# Patient Record
Sex: Male | Born: 1940 | Race: White | Hispanic: No | Marital: Married | Smoking: Former smoker
Health system: Southern US, Community
[De-identification: ages and names within clinical notes are randomized; demographics above are authoritative.]

## PROBLEM LIST (undated history)

## (undated) DIAGNOSIS — T4145XA Adverse effect of unspecified anesthetic, initial encounter: Secondary | ICD-10-CM

## (undated) DIAGNOSIS — I442 Atrioventricular block, complete: Secondary | ICD-10-CM

## (undated) DIAGNOSIS — E119 Type 2 diabetes mellitus without complications: Secondary | ICD-10-CM

## (undated) DIAGNOSIS — I2510 Atherosclerotic heart disease of native coronary artery without angina pectoris: Secondary | ICD-10-CM

## (undated) DIAGNOSIS — T8859XA Other complications of anesthesia, initial encounter: Secondary | ICD-10-CM

## (undated) DIAGNOSIS — I10 Essential (primary) hypertension: Secondary | ICD-10-CM

## (undated) DIAGNOSIS — D649 Anemia, unspecified: Secondary | ICD-10-CM

## (undated) DIAGNOSIS — Z950 Presence of cardiac pacemaker: Secondary | ICD-10-CM

## (undated) DIAGNOSIS — K219 Gastro-esophageal reflux disease without esophagitis: Secondary | ICD-10-CM

## (undated) HISTORY — PX: INSERT / REPLACE / REMOVE PACEMAKER: SUR710

## (undated) HISTORY — PX: COLONOSCOPY: SHX174

## (undated) HISTORY — PX: EYE SURGERY: SHX253

## (undated) HISTORY — PX: HERNIA REPAIR: SHX51

## (undated) HISTORY — PX: APPENDECTOMY: SHX54

---

## 2004-12-13 ENCOUNTER — Other Ambulatory Visit: Payer: Self-pay

## 2004-12-14 ENCOUNTER — Other Ambulatory Visit: Payer: Self-pay

## 2004-12-14 ENCOUNTER — Inpatient Hospital Stay: Payer: Self-pay | Admitting: Internal Medicine

## 2005-03-30 ENCOUNTER — Other Ambulatory Visit: Payer: Self-pay

## 2005-03-31 ENCOUNTER — Inpatient Hospital Stay: Payer: Self-pay | Admitting: Internal Medicine

## 2005-03-31 ENCOUNTER — Other Ambulatory Visit: Payer: Self-pay

## 2007-11-15 ENCOUNTER — Ambulatory Visit: Payer: Self-pay | Admitting: Internal Medicine

## 2008-10-01 ENCOUNTER — Ambulatory Visit: Payer: Self-pay | Admitting: Family Medicine

## 2008-10-01 ENCOUNTER — Emergency Department: Payer: Self-pay | Admitting: Unknown Physician Specialty

## 2008-10-03 ENCOUNTER — Inpatient Hospital Stay: Payer: Self-pay | Admitting: Internal Medicine

## 2008-10-03 ENCOUNTER — Ambulatory Visit: Payer: Self-pay | Admitting: Internal Medicine

## 2008-10-09 ENCOUNTER — Ambulatory Visit: Payer: Self-pay | Admitting: Internal Medicine

## 2008-10-16 ENCOUNTER — Observation Stay: Payer: Self-pay | Admitting: Internal Medicine

## 2008-11-02 ENCOUNTER — Ambulatory Visit: Payer: Self-pay | Admitting: Internal Medicine

## 2010-05-19 ENCOUNTER — Ambulatory Visit: Payer: Self-pay | Admitting: Gastroenterology

## 2010-05-21 LAB — PATHOLOGY REPORT

## 2011-01-06 IMAGING — CR DG CHEST 1V PORT
1 series · 1 of 1 positions shown · non-contrast
Comparison: none

REASON FOR EXAM: fever chills
COMMENTS:

[view not recorded]
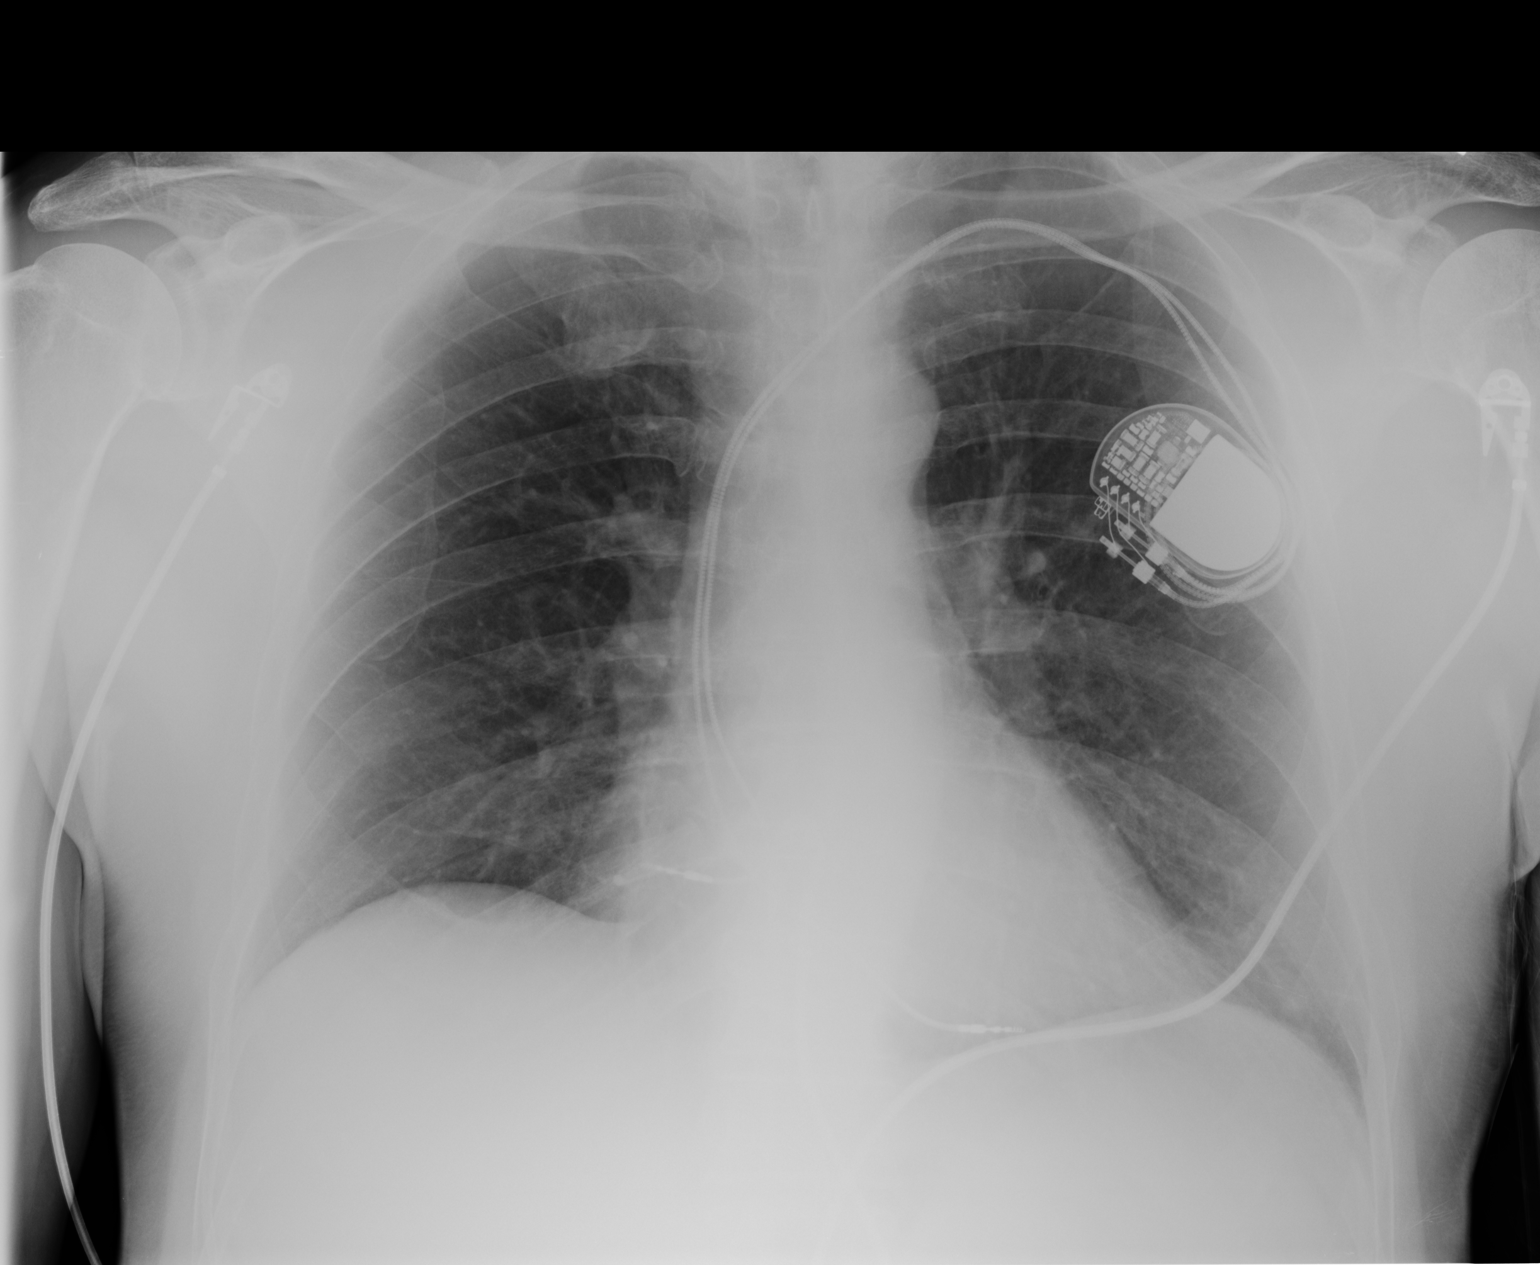

[1 of 1 positions shown; findings below may reference images not displayed]

PROCEDURE:     DXR - DXR PORTABLE CHEST SINGLE VIEW  - October 01, 2008  [DATE]

RESULT:     Comparison is made to the study of 03/31/2005. A left-sided
pacemaker has been placed since the previous exam. There is no pneumothorax.
The lungs are clear. The heart appears normal. The bony structures are
unremarkable.
IMPRESSION: Pacemaker present. No acute cardiopulmonary disease evident.

## 2011-01-08 IMAGING — CR DG CHEST 2V
1 series · 3 of 3 positions shown · non-contrast
Comparison: none

REASON FOR EXAM: thrombocytopenia
COMMENTS:

[Series 1: view not recorded · 0.17mm/px · 3 of 3 slices shown]
[im 1/3]
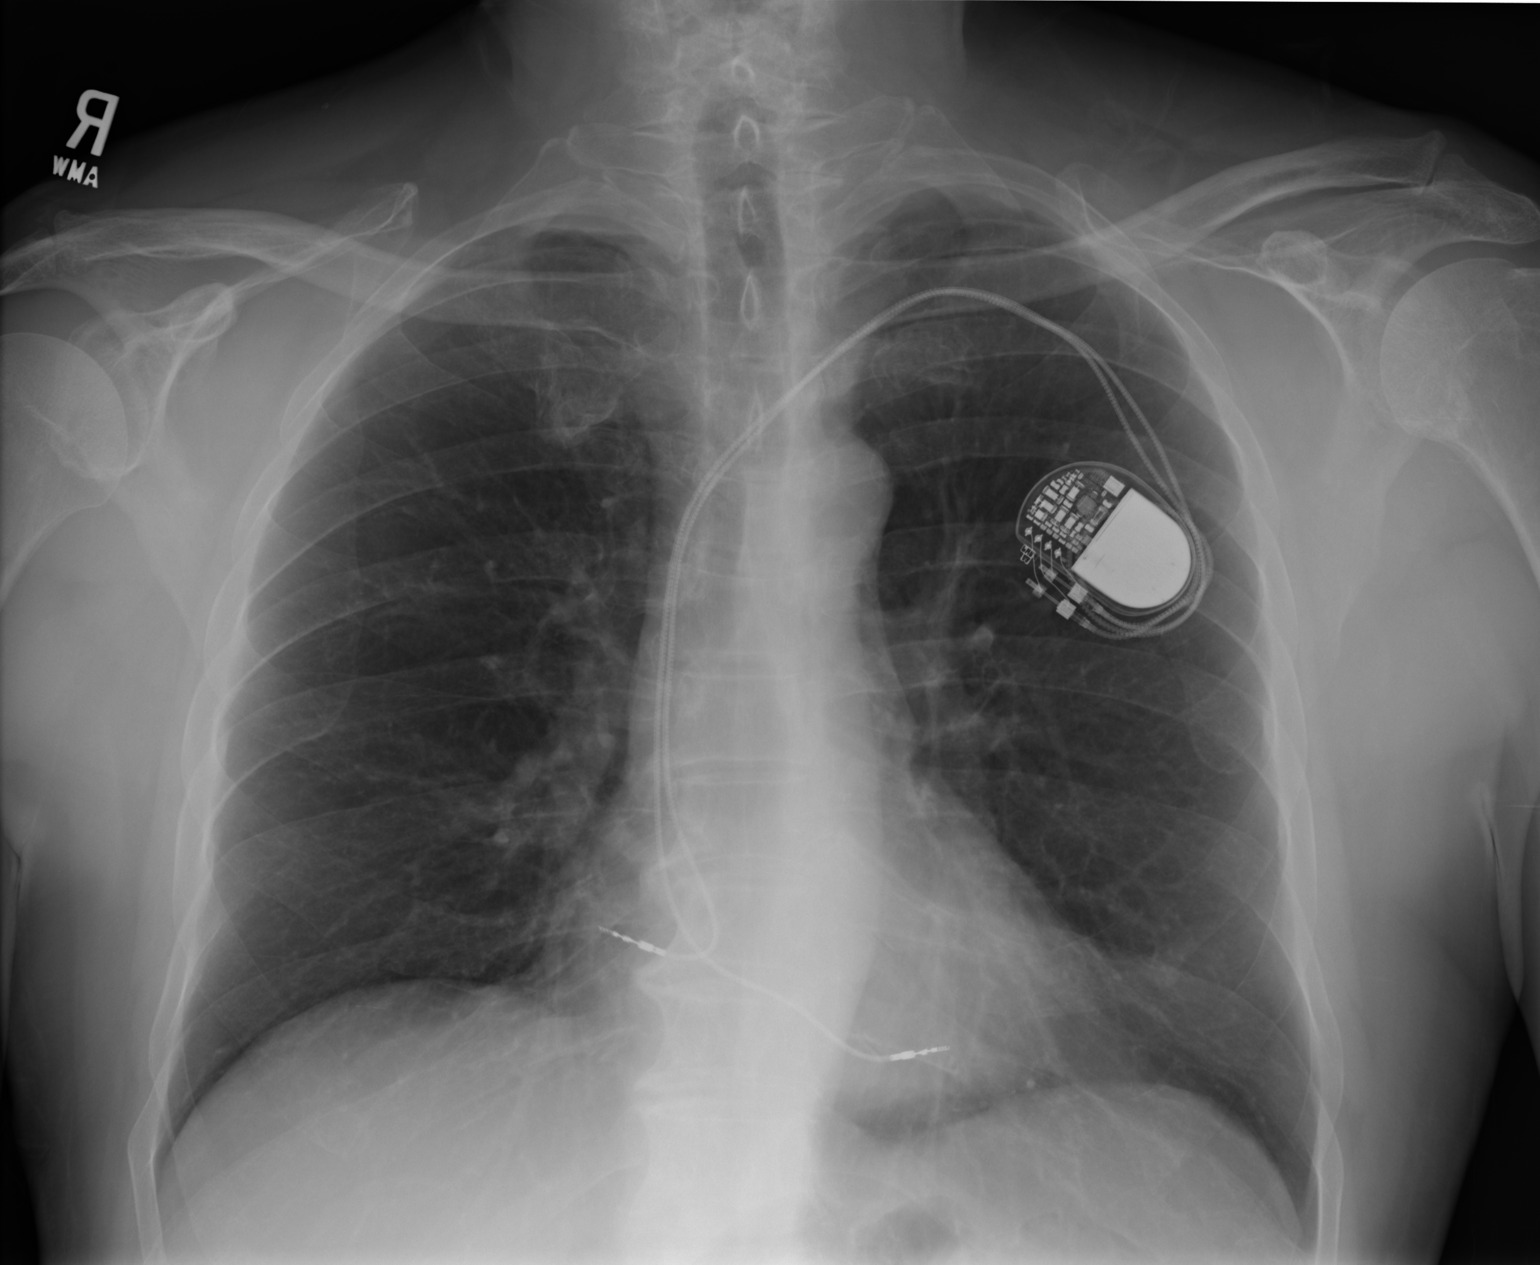
[im 2/3]
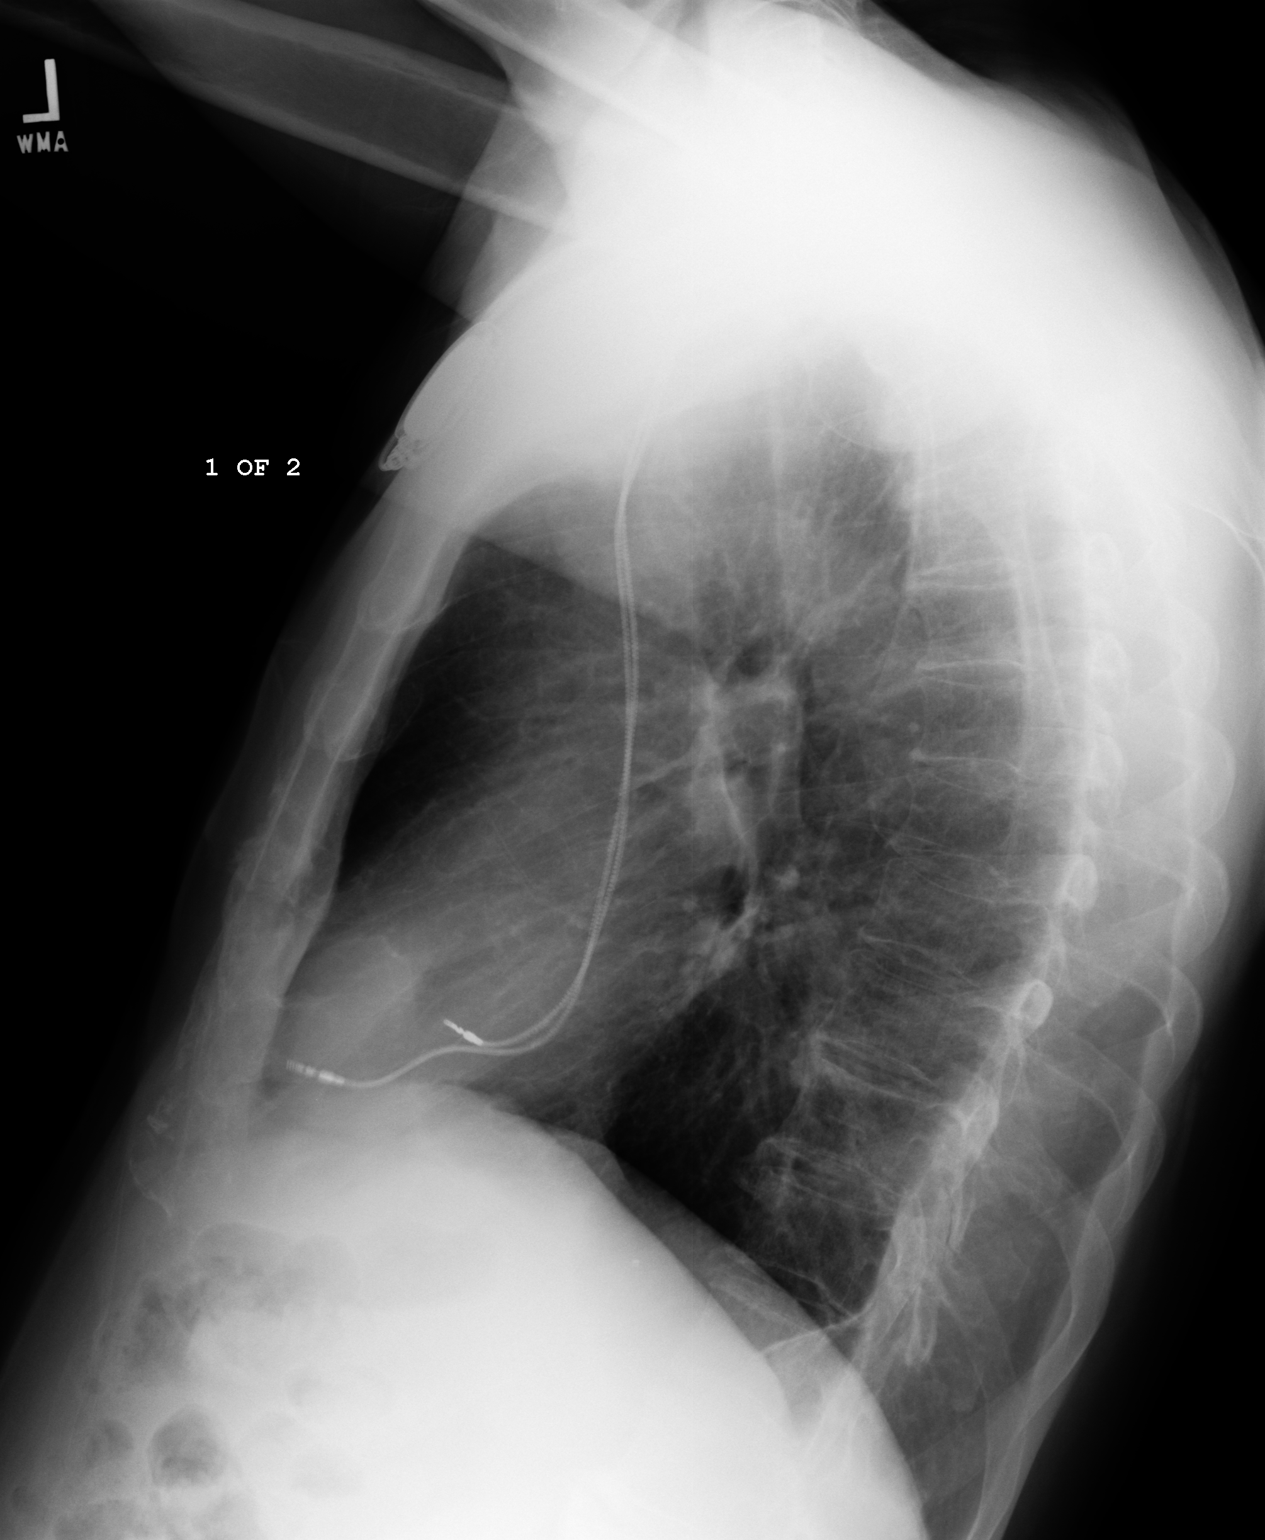
[im 3/3]
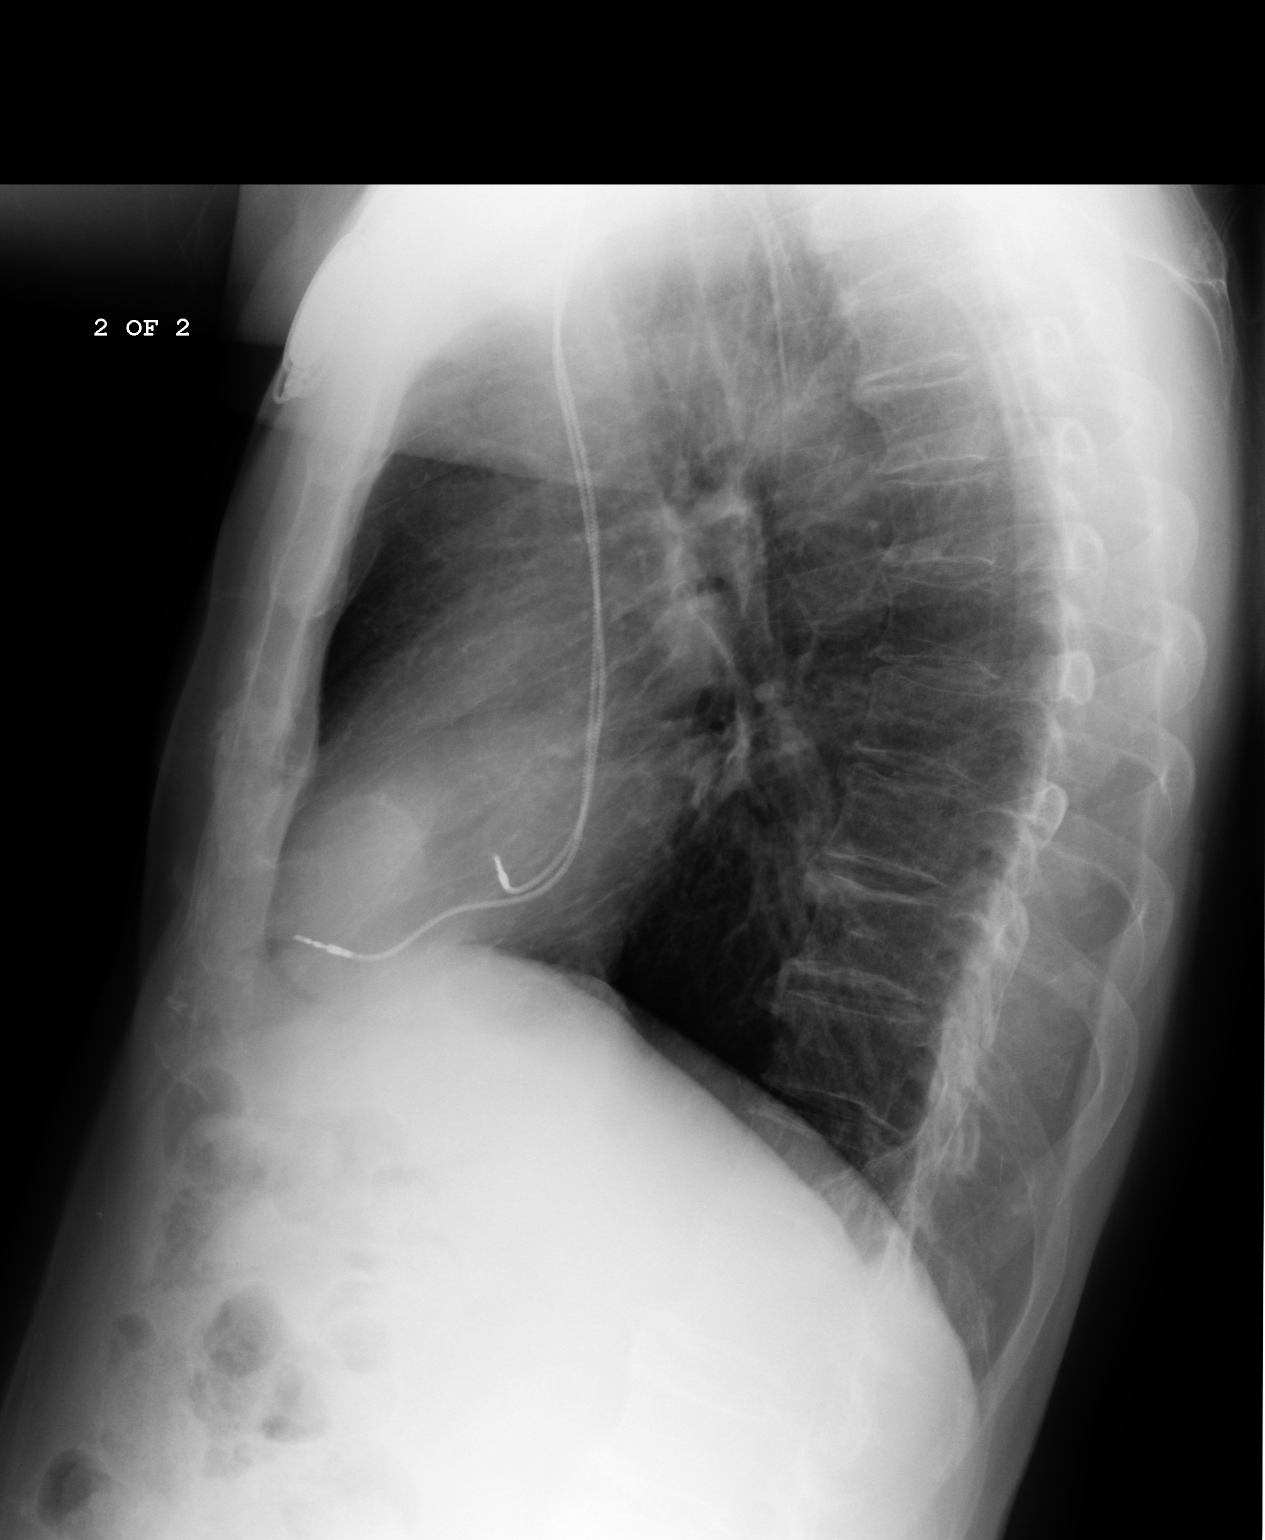

[3 of 3 positions shown; findings below may reference images not displayed]

PROCEDURE:     DXR - DXR CHEST PA (OR AP) AND LATERAL  - October 03, 2008  [DATE]

RESULT:      Comparison is made to a prior study dated 10/01/08.

There is no evidence of focal infiltrates, effusions or edema. A left-sided
pectoralis pacing unit is appreciated with lead tips projecting in the
region of the right atrium and right ventricle. The visualized bony skeleton
is unremarkable. The cardiac silhouette is within upper limits of normal.
IMPRESSION: No evidence of acute cardiopulmonary disease.

## 2011-01-08 IMAGING — CT CT HEAD WITHOUT CONTRAST
2 series · 16 of 30 positions shown, 20 images · non-contrast
Comparison: none

REASON FOR EXAM: Headache, Very low platelet count
COMMENTS:

PROCEDURE:     CT  - CT HEAD WITHOUT CONTRAST  - October 03, 2008  [DATE]
RESULT:     Comparison:  None
TECHNIQUE: Multiple axial images from the foramen magnum to the vertex were
obtained without IV contrast.

[Series 2: without · axial · non-contrast · 0.41mm/px · z∈[+750,+876]mm · 13 of 31 slices shown, 17 images]
[im 3/31  brain]
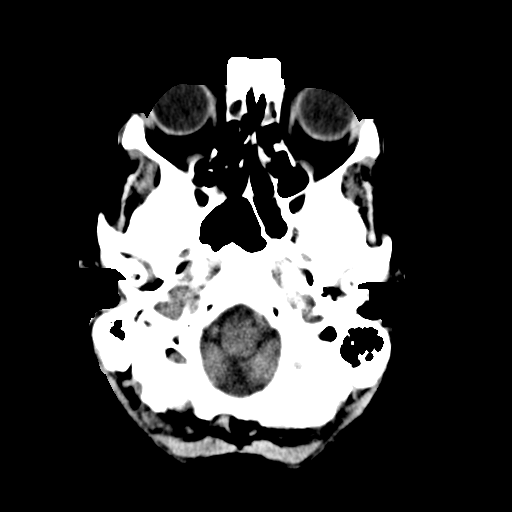
[im 3/31  bone]
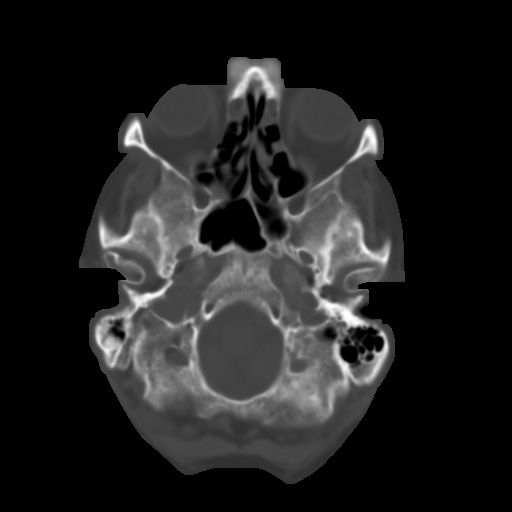
[im 5/31  brain]
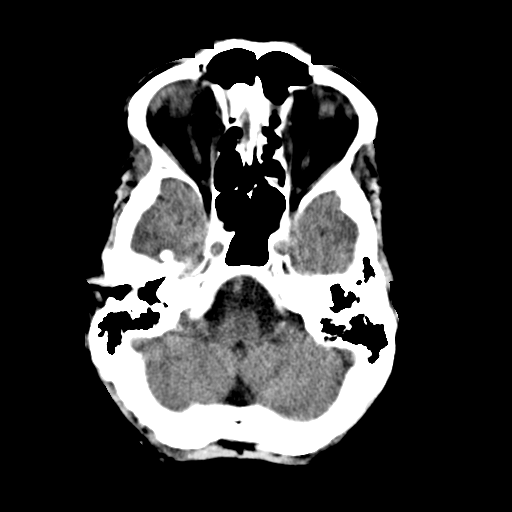
[im 7/31  brain]
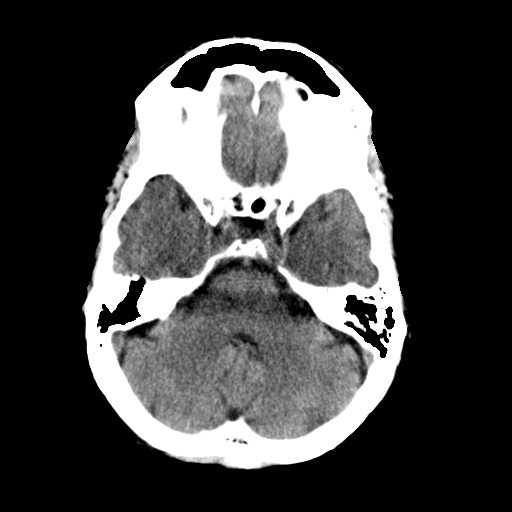
[im 9/31  brain]
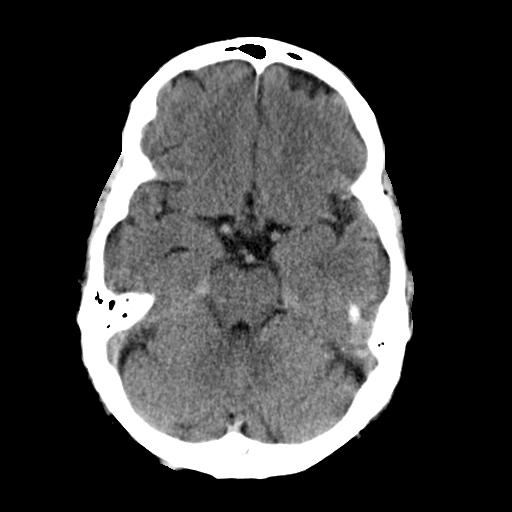
[im 11/31  brain]
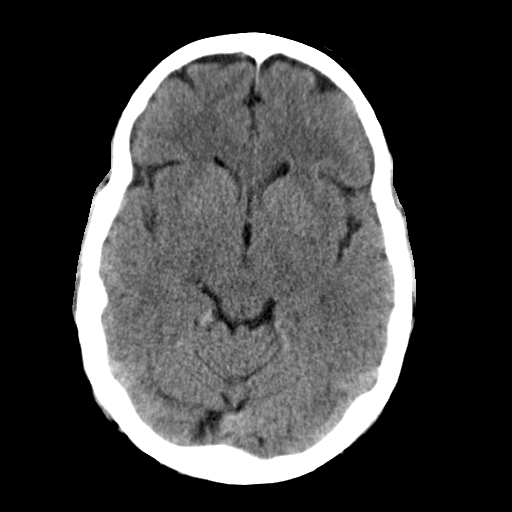
[im 11/31  bone]
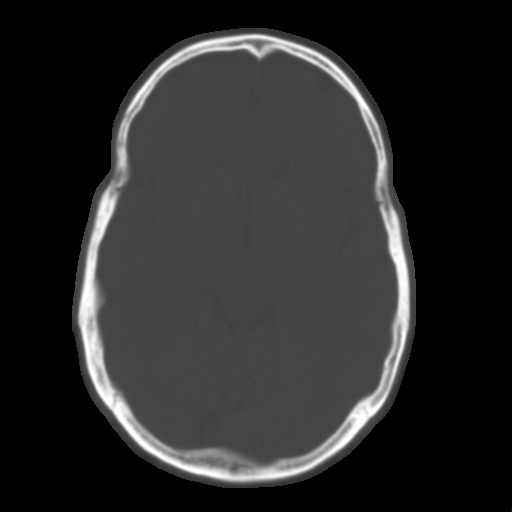
[im 13/31  brain]
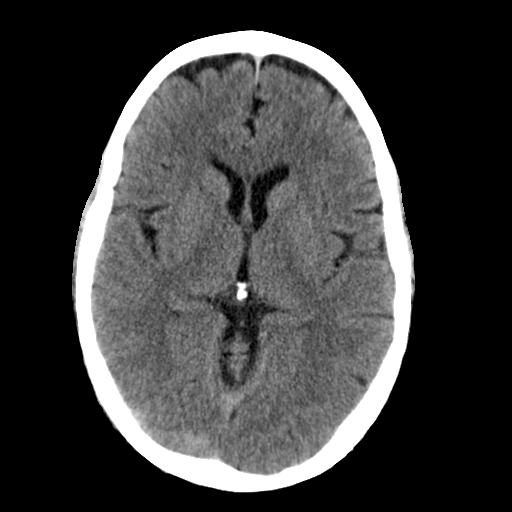
[im 16/31  brain]
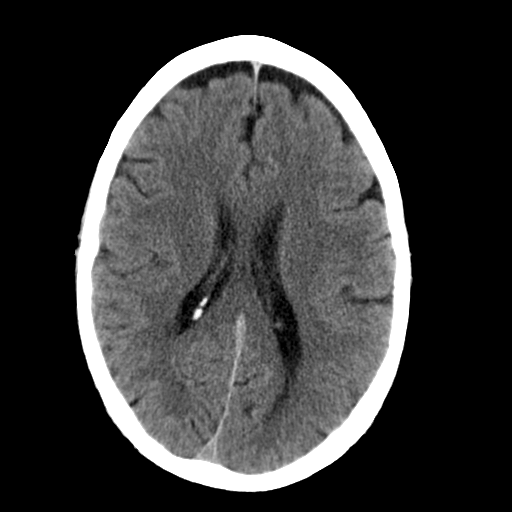
[im 18/31  brain]
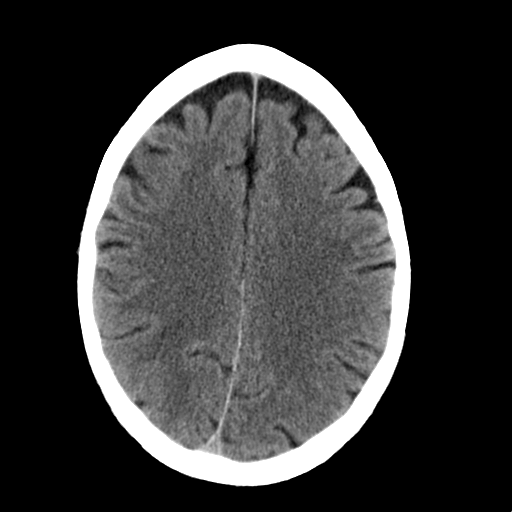
[im 20/31  brain]
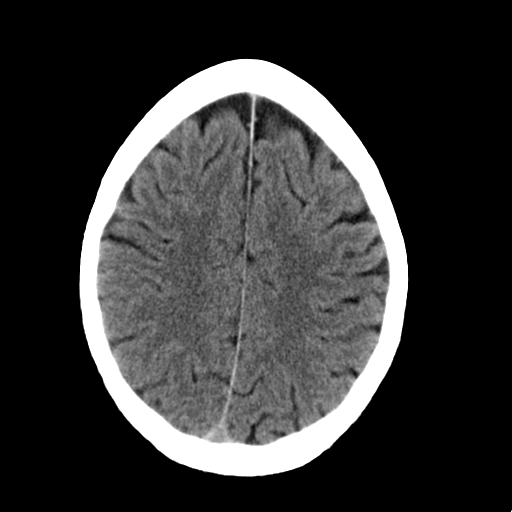
[im 20/31  bone]
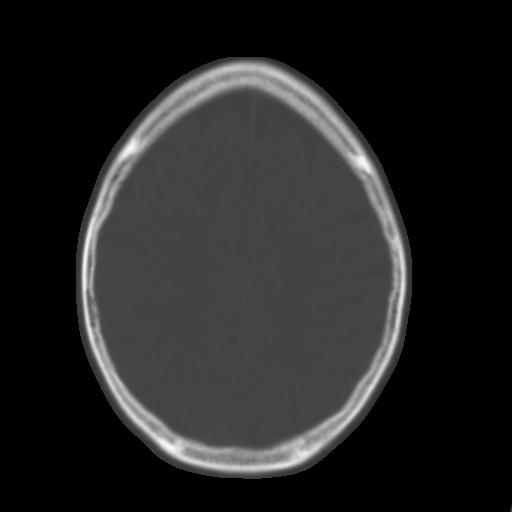
[im 22/31  brain]
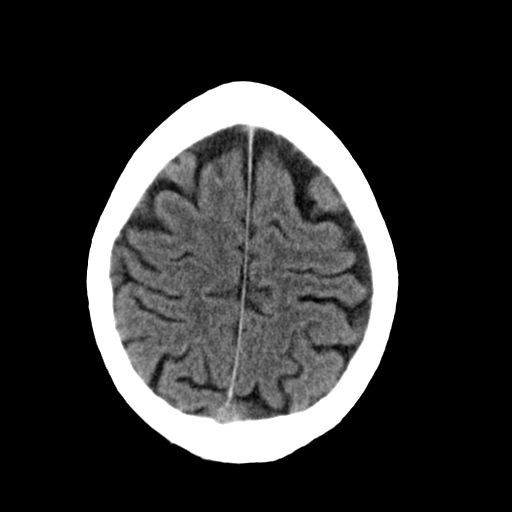
[im 24/31  brain]
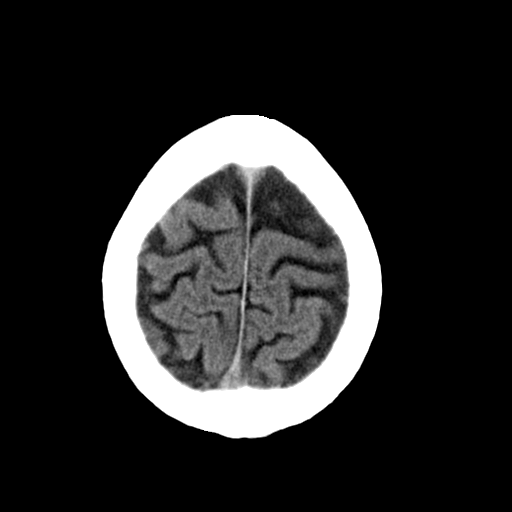
[im 26/31  brain]
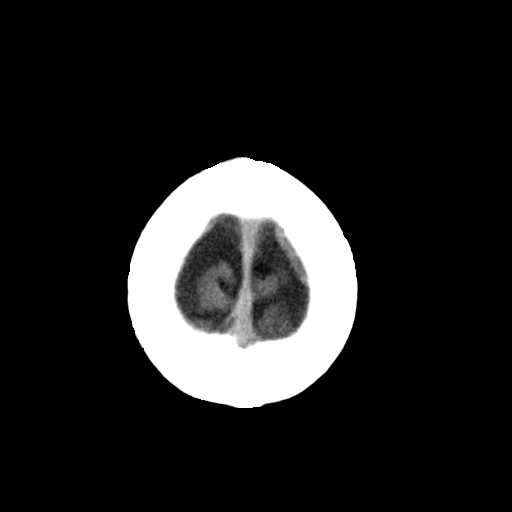
[im 28/31  brain]
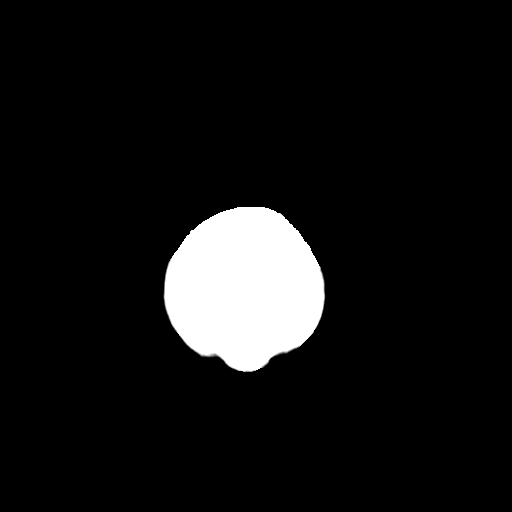
[im 28/31  bone]
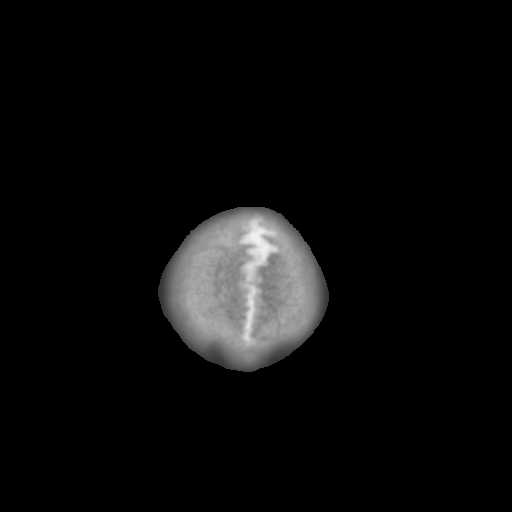

[Series 3: bone · axial · 0.41mm/px · z∈[+750,+790]mm · 3 of 31 slices shown]
[im 3/31  bone]
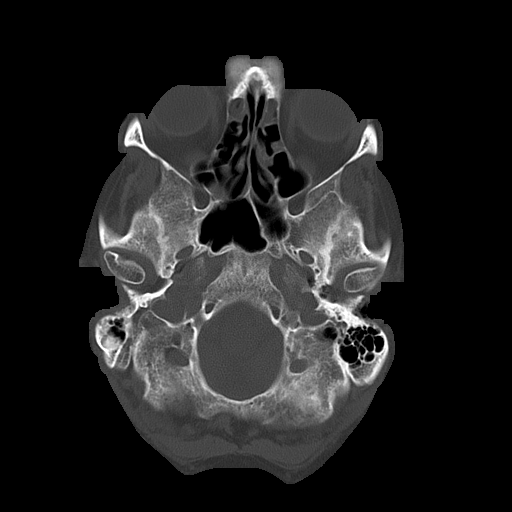
[im 7/31  bone]
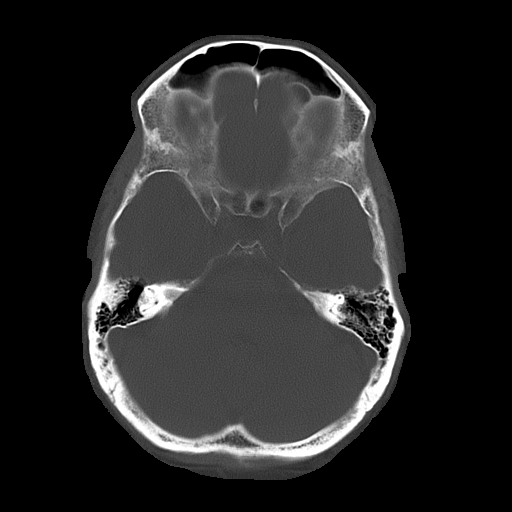
[im 11/31  bone]
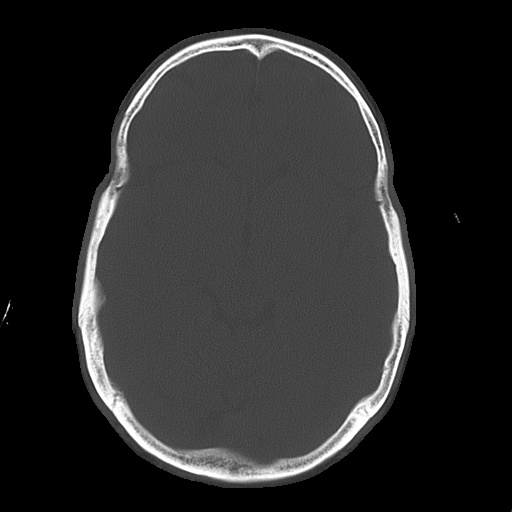

[16 of 30 positions shown; findings below may reference images not displayed]

FINDINGS: There is no evidence of mass effect, midline shift, or extra-axial fluid
collections.  There is no evidence of a space-occupying lesion or
intracranial hemorrhage. There is no evidence of a cortical-based area of
acute infarction. There is generalized cerebral atrophy.

The ventricles and sulci are appropriate for the patient's age. The basal
cisterns are patent.

Visualized portions of the orbits are unremarkable. There is mucosal
thickening involving the ethmoid sinuses.

The osseous structures are unremarkable.
IMPRESSION: No acute intracranial process.

## 2013-07-10 ENCOUNTER — Ambulatory Visit: Payer: Self-pay | Admitting: Surgery

## 2013-07-10 LAB — CBC WITH DIFFERENTIAL/PLATELET
Basophil #: 0.1 10*3/uL (ref 0.0–0.1)
Basophil %: 0.7 %
EOS ABS: 0.4 10*3/uL (ref 0.0–0.7)
EOS PCT: 4.7 %
HCT: 42.2 % (ref 40.0–52.0)
HGB: 14 g/dL (ref 13.0–18.0)
Lymphocyte #: 3.3 10*3/uL (ref 1.0–3.6)
Lymphocyte %: 38.9 %
MCH: 33.1 pg (ref 26.0–34.0)
MCHC: 33.2 g/dL (ref 32.0–36.0)
MCV: 100 fL (ref 80–100)
MONO ABS: 0.5 x10 3/mm (ref 0.2–1.0)
Monocyte %: 6 %
Neutrophil #: 4.2 10*3/uL (ref 1.4–6.5)
Neutrophil %: 49.7 %
Platelet: 159 10*3/uL (ref 150–440)
RBC: 4.24 10*6/uL — AB (ref 4.40–5.90)
RDW: 12.8 % (ref 11.5–14.5)
WBC: 8.5 10*3/uL (ref 3.8–10.6)

## 2013-07-10 LAB — BASIC METABOLIC PANEL
Anion Gap: 7 (ref 7–16)
BUN: 25 mg/dL — ABNORMAL HIGH (ref 7–18)
CALCIUM: 9.5 mg/dL (ref 8.5–10.1)
CO2: 24 mmol/L (ref 21–32)
CREATININE: 0.91 mg/dL (ref 0.60–1.30)
Chloride: 107 mmol/L (ref 98–107)
EGFR (African American): >60
Glucose: 124 mg/dL — ABNORMAL HIGH (ref 65–99)
Osmolality: 281 (ref 275–301)
Potassium: 4.2 mmol/L (ref 3.5–5.1)
Sodium: 138 mmol/L (ref 136–145)

## 2013-07-19 ENCOUNTER — Ambulatory Visit: Payer: Self-pay | Admitting: Surgery

## 2014-05-26 NOTE — Op Note (Signed)
PATIENT NAME:  Andrew Decker, [name] MR#:  [id number] DATE OF BIRTH:  25-Oct-1940  DATE OF PROCEDURE:  07/19/2013  PREOPERATIVE DIAGNOSIS: Left inguinal hernia.   POSTOPERATIVE DIAGNOSIS: Left inguinal hernia.   OPERATION: Robotic-assisted laparoscopic left inguinal hernia repair.   ANESTHESIA: General.   SURGEON: Quentin Orealph L. Ely III, MD  OPERATIVE PROCEDURE: With the patient in the supine position after the induction of appropriate general anesthesia, the patient's abdomen and groin were prepped with ChloraPrep and draped with sterile towels. The patient was placed in the head down, feet up position. A small infraumbilical incision was made in the standard fashion and carried down bluntly through the subcutaneous tissue. A Veress needle was used to cannulate the peritoneal cavity. CO2 was insufflated to appropriate pressure measurements. When approximately 2.5 liters of CO2 was instilled, the Veress needle was withdrawn, and an 8.5 mm robotic port was inserted into the peritoneal cavity. Intraperitoneal position was confirmed. The camera was used to inspect the abdomen. There was a large indirect hernia on the left side. There was some mild weakness in the right inguinal floor, but no obvious direct or indirect hernia. Two lateral ports, 8.5 mm in size, were inserted under direct vision, and an assistant port, 10 mm in size, was inserted in the right upper quadrant. The robot was brought to the table, docked, and the instruments inserted under direct vision. The peritoneum was taken down from the medial umbilical fold laterally across the epigastric vessels. It was peeled back from the epigastric vessels and the Cooper's ligament. The cord structures were identified. 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 The sac was dissected free from the cord without difficulty. Once satisfactory pocket had been created. Atrium ProLite mesh was brought to the table, appropriately fashioned and inserted into the preperitoneal space.  It was sutured in place with 3-0 Vicryl. The peritoneum was then closed with 0 V-Loc suture. The repair appeared to be satisfactory. All ports were withdrawn without difficulty after undocking the robot, and the abdomen was desufflated. The skin incisions were all closed with 5-0 nylon. The area was infiltrated with 0.25% Marcaine for postoperative pain control. Sterile dressings were applied. The patient was returned to the recovery room, having tolerated the procedure well. Sponge, instrument and needle counts were correct x2 in the operating room.   ____________________________ Quentin Orealph L. Ely III, MD rle:lb D: 07/19/2013 09:03:29 ET T: 07/19/2013 09:11:55 ET JOB#: [id number]  cc: Carmie Endalph L. Ely III, MD, <Dictator> Durward MallardJoel B. Marguerite OleaMoffett, MD Quentin OreALPH L ELY MD ELECTRONICALLY SIGNED 07/21/2013 18:12

## 2014-06-19 ENCOUNTER — Ambulatory Visit
Admission: RE | Admit: 2014-06-19 | Discharge: 2014-06-19 | Disposition: A | Discharge status: Home / Self Care | Payer: Medicare PPO | Source: Ambulatory Visit | Attending: Cardiology | Admitting: Cardiology

## 2014-06-19 ENCOUNTER — Encounter
Admission: RE | Admit: 2014-06-19 | Discharge: 2014-06-19 | Disposition: A | Discharge status: Home / Self Care | Payer: Medicare PPO | Source: Ambulatory Visit | Attending: Cardiology | Admitting: Cardiology

## 2014-06-19 DIAGNOSIS — I10 Essential (primary) hypertension: Secondary | ICD-10-CM | POA: Diagnosis not present

## 2014-06-19 HISTORY — DX: Atherosclerotic heart disease of native coronary artery without angina pectoris: I25.10

## 2014-06-19 HISTORY — DX: Essential (primary) hypertension: I10

## 2014-06-19 HISTORY — DX: Presence of cardiac pacemaker: Z95.0

## 2014-06-19 HISTORY — DX: Type 2 diabetes mellitus without complications: E11.9

## 2014-06-19 HISTORY — DX: Other complications of anesthesia, initial encounter: T88.59XA

## 2014-06-19 HISTORY — DX: Atrioventricular block, complete: I44.2

## 2014-06-19 HISTORY — DX: Adverse effect of unspecified anesthetic, initial encounter: T41.45XA

## 2014-06-19 HISTORY — DX: Gastro-esophageal reflux disease without esophagitis: K21.9

## 2014-06-19 HISTORY — DX: Anemia, unspecified: D64.9

## 2014-06-19 LAB — CBC
HEMATOCRIT: 40.7 % (ref 40.0–52.0)
Hemoglobin: 14 g/dL (ref 13.0–18.0)
MCH: 33.9 pg (ref 26.0–34.0)
MCHC: 34.4 g/dL (ref 32.0–36.0)
MCV: 98.7 fL (ref 80.0–100.0)
Platelets: 170 10*3/uL (ref 150–440)
RBC: 4.13 MIL/uL — ABNORMAL LOW (ref 4.40–5.90)
RDW: 13.2 % (ref 11.5–14.5)
WBC: 6.6 10*3/uL (ref 3.8–10.6)

## 2014-06-19 LAB — DIFFERENTIAL
Basophils Absolute: 0.1 10*3/uL (ref 0–0.1)
Basophils Relative: 1 %
EOS ABS: 0.3 10*3/uL (ref 0–0.7)
EOS PCT: 4 %
Lymphocytes Relative: 42 %
Lymphs Abs: 2.8 10*3/uL (ref 1.0–3.6)
Monocytes Absolute: 0.5 10*3/uL (ref 0.2–1.0)
Monocytes Relative: 8 %
Neutro Abs: 3 10*3/uL (ref 1.4–6.5)
Neutrophils Relative %: 45 %

## 2014-06-19 LAB — BASIC METABOLIC PANEL
ANION GAP: 8 (ref 5–15)
BUN: 17 mg/dL (ref 6–20)
CHLORIDE: 107 mmol/L (ref 101–111)
CO2: 25 mmol/L (ref 22–32)
Calcium: 9.3 mg/dL (ref 8.9–10.3)
Creatinine, Ser: 0.9 mg/dL (ref 0.61–1.24)
GFR calc Af Amer: >60 mL/min (ref >60)
GFR calc non Af Amer: >60 mL/min (ref >60)
Glucose, Bld: 107 mg/dL — ABNORMAL HIGH (ref 65–99)
Potassium: 4.2 mmol/L (ref 3.5–5.1)
Sodium: 140 mmol/L (ref 135–145)

## 2014-06-19 LAB — PROTIME-INR
INR: 0.96
PROTHROMBIN TIME: 13 s (ref 11.4–15.0)

## 2014-06-19 LAB — APTT: aPTT: 25 seconds (ref 24–36)

## 2014-06-26 ENCOUNTER — Ambulatory Visit: Payer: Medicare PPO | Admitting: Anesthesiology

## 2014-06-26 ENCOUNTER — Encounter
Admission: RE | Disposition: A | Discharge status: Home / Self Care | Payer: Self-pay | Source: Ambulatory Visit | Attending: Cardiology

## 2014-06-26 ENCOUNTER — Ambulatory Visit
Admission: RE | Admit: 2014-06-26 | Discharge: 2014-06-26 | Disposition: A | Discharge status: Home / Self Care | Payer: Medicare PPO | Source: Ambulatory Visit | Attending: Cardiology | Admitting: Cardiology

## 2014-06-26 DIAGNOSIS — Z888 Allergy status to other drugs, medicaments and biological substances status: Secondary | ICD-10-CM | POA: Insufficient documentation

## 2014-06-26 DIAGNOSIS — D519 Vitamin B12 deficiency anemia, unspecified: Secondary | ICD-10-CM | POA: Insufficient documentation

## 2014-06-26 DIAGNOSIS — Z9889 Other specified postprocedural states: Secondary | ICD-10-CM | POA: Diagnosis not present

## 2014-06-26 DIAGNOSIS — Z87891 Personal history of nicotine dependence: Secondary | ICD-10-CM | POA: Insufficient documentation

## 2014-06-26 DIAGNOSIS — E119 Type 2 diabetes mellitus without complications: Secondary | ICD-10-CM | POA: Insufficient documentation

## 2014-06-26 DIAGNOSIS — Z45010 Encounter for checking and testing of cardiac pacemaker pulse generator [battery]: Secondary | ICD-10-CM | POA: Diagnosis not present

## 2014-06-26 DIAGNOSIS — I442 Atrioventricular block, complete: Secondary | ICD-10-CM | POA: Diagnosis present

## 2014-06-26 DIAGNOSIS — Z79899 Other long term (current) drug therapy: Secondary | ICD-10-CM | POA: Insufficient documentation

## 2014-06-26 DIAGNOSIS — Z8249 Family history of ischemic heart disease and other diseases of the circulatory system: Secondary | ICD-10-CM | POA: Insufficient documentation

## 2014-06-26 DIAGNOSIS — E781 Pure hyperglyceridemia: Secondary | ICD-10-CM | POA: Diagnosis not present

## 2014-06-26 DIAGNOSIS — Z883 Allergy status to other anti-infective agents status: Secondary | ICD-10-CM | POA: Insufficient documentation

## 2014-06-26 DIAGNOSIS — I10 Essential (primary) hypertension: Secondary | ICD-10-CM | POA: Diagnosis not present

## 2014-06-26 DIAGNOSIS — Z833 Family history of diabetes mellitus: Secondary | ICD-10-CM | POA: Insufficient documentation

## 2014-06-26 DIAGNOSIS — Z91013 Allergy to seafood: Secondary | ICD-10-CM | POA: Diagnosis not present

## 2014-06-26 HISTORY — PX: PACEMAKER LEAD REMOVAL: SHX5064

## 2014-06-26 LAB — GLUCOSE, CAPILLARY: Glucose-Capillary: 118 mg/dL — ABNORMAL HIGH (ref 65–99)

## 2014-06-26 SURGERY — REMOVAL, ELECTRODE LEAD, CARDIAC PACEMAKER, WITHOUT REPLACEMENT
Anesthesia: Monitor Anesthesia Care | Site: Chest | Wound class: Clean

## 2014-06-26 MED ORDER — CEFAZOLIN SODIUM-DEXTROSE 2-3 GM-% IV SOLR
INTRAVENOUS | Status: AC
  Administered 2014-06-26: 2 g via INTRAVENOUS
  Filled 2014-06-26: qty 50

## 2014-06-26 MED ORDER — SODIUM CHLORIDE 0.9 % IR SOLN
Status: DC | PRN
  Administered 2014-06-26: 200 mL

## 2014-06-26 MED ORDER — SODIUM CHLORIDE 0.9 % IV SOLN
INTRAVENOUS | Status: DC
  Administered 2014-06-26: 12:00:00 via INTRAVENOUS

## 2014-06-26 MED ORDER — GENTAMICIN SULFATE 40 MG/ML IJ SOLN
Freq: Once | INTRAMUSCULAR | Status: DC
  Filled 2014-06-26: qty 2

## 2014-06-26 MED ORDER — CEFAZOLIN SODIUM 1-5 GM-% IV SOLN
1.0000 g | Freq: Once | INTRAVENOUS | Status: AC
  Administered 2014-06-26: 1 g via INTRAVENOUS

## 2014-06-26 MED ORDER — CEPHALEXIN 250 MG PO CAPS: 250.0000 mg | ORAL_CAPSULE | Freq: Four times a day (QID) | ORAL | Status: DC

## 2014-06-26 MED ORDER — FAMOTIDINE 20 MG PO TABS
ORAL_TABLET | ORAL | Status: AC
  Filled 2014-06-26: qty 1

## 2014-06-26 MED ORDER — MIDAZOLAM HCL 2 MG/2ML IJ SOLN
INTRAMUSCULAR | Status: DC | PRN
Start: 2014-06-26 — End: 2014-06-26
  Administered 2014-06-26 (×2): 1 mg via INTRAVENOUS

## 2014-06-26 MED ORDER — LIDOCAINE HCL 1 % IJ SOLN
INTRAMUSCULAR | Status: DC | PRN
  Administered 2014-06-26: 23 mL

## 2014-06-26 MED ORDER — FAMOTIDINE 20 MG PO TABS
20.0000 mg | ORAL_TABLET | Freq: Once | ORAL | Status: AC
  Administered 2014-06-26: 20 mg via ORAL

## 2014-06-26 MED ORDER — LIDOCAINE HCL (PF) 1 % IJ SOLN
INTRAMUSCULAR | Status: DC
Start: 2014-06-26 — End: 2014-06-26
  Filled 2014-06-26: qty 2

## 2014-06-26 MED ORDER — GENTAMICIN SULFATE 40 MG/ML IJ SOLN
INTRAMUSCULAR | Status: AC
Start: 2014-06-26 — End: 2014-06-26
  Filled 2014-06-26: qty 2

## 2014-06-26 MED ORDER — FENTANYL CITRATE (PF) 100 MCG/2ML IJ SOLN
INTRAMUSCULAR | Status: DC | PRN
  Administered 2014-06-26: 50 ug via INTRAVENOUS

## 2014-06-26 SURGICAL SUPPLY — 42 items
BAG DECANTER STRL (MISCELLANEOUS) ×3 IMPLANT
BENZOIN TINCTURE PRP APPL 2/3 (GAUZE/BANDAGES/DRESSINGS) ×3 IMPLANT
BLADE CLIPPER SURG (BLADE) ×3 IMPLANT
BLADE SURG SZ10 CARB STEEL (BLADE) ×3 IMPLANT
CABLE SURG 12 DISP A/V CHANNEL (MISCELLANEOUS) ×3 IMPLANT
CANISTER SUCT 1200ML W/VALVE (MISCELLANEOUS) ×3 IMPLANT
CHLORAPREP W/TINT 26ML (MISCELLANEOUS) ×3 IMPLANT
CLOSURE WOUND 1/2 X4 (GAUZE/BANDAGES/DRESSINGS) ×1
COVER LIGHT HANDLE STERIS (MISCELLANEOUS) ×6 IMPLANT
COVER MAYO STAND STRL (DRAPES) ×3 IMPLANT
DEVICE DISSECT PLASMABLAD 3.0S (MISCELLANEOUS) ×1 IMPLANT
DRAPE C-ARM XRAY 36X54 (DRAPES) IMPLANT
DRESSING TELFA 4X3 1S ST N-ADH (GAUZE/BANDAGES/DRESSINGS) ×3 IMPLANT
DRSG TEGADERM 4X4.75 (GAUZE/BANDAGES/DRESSINGS) ×3 IMPLANT
DRSG TEGADERM 8X12 (GAUZE/BANDAGES/DRESSINGS) ×3 IMPLANT
DRSG TELFA 3X8 NADH (GAUZE/BANDAGES/DRESSINGS) ×3 IMPLANT
GLOVE BIO SURGEON STRL SZ7.5 (GLOVE) ×9 IMPLANT
GLOVE BIO SURGEON STRL SZ8 (GLOVE) ×3 IMPLANT
GOWN STRL REUS W/ TWL LRG LVL3 (GOWN DISPOSABLE) ×1 IMPLANT
GOWN STRL REUS W/ TWL XL LVL3 (GOWN DISPOSABLE) ×1 IMPLANT
GOWN STRL REUS W/TWL LRG LVL3 (GOWN DISPOSABLE) ×2
GOWN STRL REUS W/TWL XL LVL3 (GOWN DISPOSABLE) ×2
IMMOBILIZER SHDR LG LX 900803 (SOFTGOODS) ×3 IMPLANT
IV NS 1000ML (IV SOLUTION) ×2
IV NS 1000ML BAXH (IV SOLUTION) ×1 IMPLANT
KIT RM TURNOVER STRD PROC AR (KITS) ×3 IMPLANT
LABEL OR SOLS (LABEL) ×3 IMPLANT
MARKER SKIN W/RULER 31145785 (MISCELLANEOUS) ×3 IMPLANT
NDL SAFETY 25GX1.5 (NEEDLE) ×3 IMPLANT
NEEDLE FILTER BLUNT 18X 1/2SAF (NEEDLE) ×2
NEEDLE FILTER BLUNT 18X1 1/2 (NEEDLE) ×1 IMPLANT
NEEDLE SPNL 22GX3.5 QUINCKE BK (NEEDLE) IMPLANT
NS IRRIG 500ML POUR BTL (IV SOLUTION) ×3 IMPLANT
PACK PACE INSERTION (MISCELLANEOUS) ×3 IMPLANT
PAD GROUND ADULT SPLIT (MISCELLANEOUS) ×3 IMPLANT
PAD STATPAD (MISCELLANEOUS) ×3 IMPLANT
PLASMABLADE 3.0S (MISCELLANEOUS) ×3
PPM ADVISA MRI DR A2DR01 (Pacemaker) ×3 IMPLANT
STRIP CLOSURE SKIN 1/2X4 (GAUZE/BANDAGES/DRESSINGS) ×2 IMPLANT
SUT SILK 0 SH 30 (SUTURE) ×3 IMPLANT
SUT VIC AB 2-0 SH 27 (SUTURE) ×2
SUT VIC AB 2-0 SH 27XBRD (SUTURE) ×1 IMPLANT

## 2014-06-26 NOTE — Discharge Instructions (Addendum)
Keep site clean, remove outer dressing, leave steri strips in place.AMBULATORY SURGERY  DISCHARGE INSTRUCTIONS   1) The drugs that you were given will stay in your system until tomorrow so for the next 24 hours you should not:  A) Drive an automobile B) Make any legal decisions C) Drink any alcoholic beverage   2) You may resume regular meals tomorrow.  Today it is better to start with liquids and gradually work up to solid foods.  You may eat anything you prefer, but it is better to start with liquids, then soup and crackers, and gradually work up to solid foods.   3) Please notify your doctor immediately if you have any unusual bleeding, trouble breathing, redness and pain at the surgery site, drainage, fever, or pain not relieved by medication. 4)     Please call to schedule your post-operative visit.  5) Additional Instructions: No heavy lifting.

## 2014-06-26 NOTE — Anesthesia Procedure Notes (Signed)
Procedure Name: MAC Performed by: Charna BusmanIAMOND, Shyleigh Daughtry Pre-anesthesia Checklist: Patient identified Patient Re-evaluated:Patient Re-evaluated prior to induction

## 2014-06-26 NOTE — Transfer of Care (Signed)
Immediate Anesthesia Transfer of Care Note  Patient: Andrew PitmanMervin W Decker  Procedure(s) Performed: Procedure(s): PACEMAKER LEAD REMOVAL/DUAL CHAMBER GENERATOR CHANGE OUT (N/A)  Patient Location: PACU  Anesthesia Type:MAC  Level of Consciousness: awake, oriented and patient cooperative  Airway & Oxygen Therapy: Patient Spontanous Breathing and Patient connected to face mask oxygen  Post-op Assessment: Report given to RN, Post -op Vital signs reviewed and stable and Patient moving all extremities  Post vital signs: stable  Last Vitals:  Filed Vitals:   06/26/14 1306  BP:   Pulse:   Temp: 36.9 C  Resp:     Complications: No apparent anesthesia complications

## 2014-06-26 NOTE — Anesthesia Postprocedure Evaluation (Signed)
  Anesthesia Post-op Note  Patient: Andrew Decker  Procedure(s) Performed: Procedure(s): PACEMAKER LEAD REMOVAL/DUAL CHAMBER GENERATOR CHANGE OUT (N/A)  Anesthesia type:MAC  Patient location: PACU  Post pain: Pain level controlled  Post assessment: Post-op Vital signs reviewed, Patient's Cardiovascular Status Stable, Respiratory Function Stable, Patent Airway and No signs of Nausea or vomiting  Post vital signs: Reviewed and stable  Last Vitals:  Filed Vitals:   06/26/14 1306  BP: 131/81  Pulse: 65  Temp: 36.9 C  Resp: 14    Level of consciousness: awake, alert  and patient cooperative  Complications: No apparent anesthesia complications

## 2014-06-26 NOTE — Op Note (Signed)
[facility]   06/26/2014                     1:10 PM  PATIENT:  Andrew Decker    PRE-OPERATIVE DIAGNOSIS:  CHB  POST-OPERATIVE DIAGNOSIS:  Same  PROCEDURE:  PACEMAKER LEAD REMOVAL/DUAL CHAMBER GENERATOR CHANGE OUT  SURGEON:  Tamecka Milham, MD    ANESTHESIA:     PREOPERATIVE INDICATIONS:  Andrew Decker is a  74 y.o. male with a diagnosis of CHB who failed conservative measures and elected for surgical management.    The risks benefits and alternatives were discussed with the patient preoperatively including but not limited to the risks of infection, bleeding, cardiopulmonary complications, the need for revision surgery, among others, and the patient was willing to proceed.   OPERATIVE PROCEDURE: The patient was brought to the operating room the fasting state. The left pectoral region was prepped and draped in the usual sterile manner. A 6 cm incision was performed over the left pectoral region. The old pacemaker generator was retrieved by electrocautery and blunt dissection. The leads were disconnected to the old pacemaker generator and connected to a new MRI compatible dual-chamber rate responsive pacemaker generator ([employer] Advisor DR MRI ). The pacemaker pocket was irrigated with gentamicin solution. The new pacemaker generator was positioned into the pocket and pocket was closed with 2-0 and 4-0 Vicryl, respectively. Steri-Strips and a pressure dressing were applied.

## 2014-06-26 NOTE — Anesthesia Preprocedure Evaluation (Addendum)
Anesthesia Evaluation  Patient identified by MRN, date of birth, ID band Patient awake    Reviewed: Allergy & Precautions, NPO status , Patient's Chart, lab work & pertinent test results  History of Anesthesia Complications (+) PONV  Airway Mallampati: II       Dental  (+) Upper Dentures   Pulmonary neg pulmonary ROS, former smoker,    Pulmonary exam normal       Cardiovascular hypertension, Pt. on medications + CAD + dysrhythmias Rhythm:Irregular     Neuro/Psych negative neurological ROS  negative psych ROS   GI/Hepatic Neg liver ROS, GERD-  ,  Endo/Other  diabetes, Type 2, Oral Hypoglycemic Agents  Renal/GU   negative genitourinary   Musculoskeletal negative musculoskeletal ROS (+)   Abdominal Normal abdominal exam  (+)   Peds negative pediatric ROS (+)  Hematology negative hematology ROS (+) anemia ,   Anesthesia Other Findings   Reproductive/Obstetrics negative OB ROS                            Anesthesia Physical Anesthesia Plan  ASA: III  Anesthesia Plan: MAC   Post-op Pain Management:    Induction: Intravenous  Airway Management Planned: Nasal Cannula  Additional Equipment:   Intra-op Plan:   Post-operative Plan:   Informed Consent: I have reviewed the patients History and Physical, chart, labs and discussed the procedure including the risks, benefits and alternatives for the proposed anesthesia with the patient or authorized representative who has indicated his/her understanding and acceptance.     Plan Discussed with: CRNA and Surgeon  Anesthesia Plan Comments:         Anesthesia Quick Evaluation

## 2014-06-27 ENCOUNTER — Encounter: Payer: Self-pay | Admitting: Cardiology

## 2017-01-05 ENCOUNTER — Encounter: Payer: Self-pay | Admitting: *Deleted

## 2017-01-05 ENCOUNTER — Other Ambulatory Visit: Payer: Self-pay

## 2017-01-13 NOTE — Discharge Instructions (Signed)

## 2017-01-18 ENCOUNTER — Ambulatory Visit: Payer: Medicare PPO | Admitting: Anesthesiology

## 2017-01-18 ENCOUNTER — Encounter
Admission: RE | Disposition: A | Discharge status: Home / Self Care | Payer: Self-pay | Source: Ambulatory Visit | Attending: Ophthalmology

## 2017-01-18 ENCOUNTER — Ambulatory Visit
Admission: RE | Admit: 2017-01-18 | Discharge: 2017-01-18 | Disposition: A | Discharge status: Home / Self Care | Payer: Medicare PPO | Source: Ambulatory Visit | Attending: Ophthalmology | Admitting: Ophthalmology

## 2017-01-18 DIAGNOSIS — E1136 Type 2 diabetes mellitus with diabetic cataract: Secondary | ICD-10-CM | POA: Diagnosis not present

## 2017-01-18 DIAGNOSIS — I10 Essential (primary) hypertension: Secondary | ICD-10-CM | POA: Diagnosis not present

## 2017-01-18 DIAGNOSIS — Z87891 Personal history of nicotine dependence: Secondary | ICD-10-CM | POA: Insufficient documentation

## 2017-01-18 HISTORY — PX: CATARACT EXTRACTION W/PHACO: SHX586

## 2017-01-18 LAB — GLUCOSE, CAPILLARY
GLUCOSE-CAPILLARY: 197 mg/dL — AB (ref 65–99)
GLUCOSE-CAPILLARY: 150 mg/dL — AB (ref 65–99)

## 2017-01-18 SURGERY — PHACOEMULSIFICATION, CATARACT, WITH IOL INSERTION
Anesthesia: Monitor Anesthesia Care | Laterality: Left

## 2017-01-18 MED ORDER — LACTATED RINGERS IV SOLN: 500.0000 mL | INTRAVENOUS | Status: DC

## 2017-01-18 MED ORDER — ARMC OPHTHALMIC DILATING DROPS
1.0000 "application " | OPHTHALMIC | Status: DC | PRN
  Administered 2017-01-18 (×3): 1 via OPHTHALMIC

## 2017-01-18 MED ORDER — MOXIFLOXACIN HCL 0.5 % OP SOLN
1.0000 [drp] | OPHTHALMIC | Status: DC | PRN
  Administered 2017-01-18 (×3): 1 [drp] via OPHTHALMIC

## 2017-01-18 MED ORDER — BRIMONIDINE TARTRATE-TIMOLOL 0.2-0.5 % OP SOLN
OPHTHALMIC | Status: DC | PRN
  Administered 2017-01-18: 1 [drp] via OPHTHALMIC

## 2017-01-18 MED ORDER — MIDAZOLAM HCL 2 MG/2ML IJ SOLN
INTRAMUSCULAR | Status: DC | PRN
  Administered 2017-01-18 (×2): 1 mg via INTRAVENOUS

## 2017-01-18 MED ORDER — NA HYALUR & NA CHOND-NA HYALUR 0.4-0.35 ML IO KIT
PACK | INTRAOCULAR | Status: DC | PRN
  Administered 2017-01-18: 1 mL via INTRAOCULAR

## 2017-01-18 MED ORDER — FENTANYL CITRATE (PF) 100 MCG/2ML IJ SOLN
INTRAMUSCULAR | Status: DC | PRN
  Administered 2017-01-18: 50 ug via INTRAVENOUS

## 2017-01-18 MED ORDER — LACTATED RINGERS IV SOLN: INTRAVENOUS | Status: DC

## 2017-01-18 MED ORDER — CEFUROXIME OPHTHALMIC INJECTION 1 MG/0.1 ML
INJECTION | OPHTHALMIC | Status: DC | PRN
  Administered 2017-01-18: 0.1 mL via INTRACAMERAL

## 2017-01-18 MED ORDER — EPINEPHRINE PF 1 MG/ML IJ SOLN
INTRAOCULAR | Status: DC | PRN
  Administered 2017-01-18: 74 mL via OPHTHALMIC

## 2017-01-18 SURGICAL SUPPLY — 25 items
CANNULA ANT/CHMB 27GA (MISCELLANEOUS) ×3 IMPLANT
CARTRIDGE ABBOTT (MISCELLANEOUS) IMPLANT
GLOVE SURG LX 7.5 STRW (GLOVE) ×2
GLOVE SURG LX STRL 7.5 STRW (GLOVE) ×1 IMPLANT
GLOVE SURG TRIUMPH 8.0 PF LTX (GLOVE) ×3 IMPLANT
GOWN STRL REUS W/ TWL LRG LVL3 (GOWN DISPOSABLE) ×2 IMPLANT
GOWN STRL REUS W/TWL LRG LVL3 (GOWN DISPOSABLE) ×4
LENS IOL TECNIS ITEC 19.0 (Intraocular Lens) ×3 IMPLANT
MARKER SKIN DUAL TIP RULER LAB (MISCELLANEOUS) ×3 IMPLANT
NDL RETROBULBAR .5 NSTRL (NEEDLE) IMPLANT
NEEDLE FILTER BLUNT 18X 1/2SAF (NEEDLE) ×2
NEEDLE FILTER BLUNT 18X1 1/2 (NEEDLE) ×1 IMPLANT
PACK CATARACT BRASINGTON (MISCELLANEOUS) ×3 IMPLANT
PACK EYE AFTER SURG (MISCELLANEOUS) ×3 IMPLANT
PACK OPTHALMIC (MISCELLANEOUS) ×3 IMPLANT
RING MALYGIN 7.0 (MISCELLANEOUS) IMPLANT
SUT ETHILON 10-0 CS-B-6CS-B-6 (SUTURE)
SUT VICRYL  9 0 (SUTURE)
SUT VICRYL 9 0 (SUTURE) IMPLANT
SUTURE EHLN 10-0 CS-B-6CS-B-6 (SUTURE) IMPLANT
SYR 3ML LL SCALE MARK (SYRINGE) ×3 IMPLANT
SYR 5ML LL (SYRINGE) ×3 IMPLANT
SYR TB 1ML LUER SLIP (SYRINGE) ×3 IMPLANT
WATER STERILE IRR 250ML POUR (IV SOLUTION) ×3 IMPLANT
WIPE NON LINTING 3.25X3.25 (MISCELLANEOUS) ×3 IMPLANT

## 2017-01-18 NOTE — Anesthesia Postprocedure Evaluation (Signed)
Anesthesia Post Note  Patient: Andrew Decker  Procedure(s) Performed: CATARACT EXTRACTION PHACO AND INTRAOCULAR LENS PLACEMENT (IOC) LEFT DIABETIC (Left )  Patient location during evaluation: PACU Anesthesia Type: MAC Level of consciousness: awake and alert Pain management: pain level controlled Vital Signs Assessment: post-procedure vital signs reviewed and stable Respiratory status: spontaneous breathing, nonlabored ventilation, respiratory function stable and patient connected to nasal cannula oxygen Cardiovascular status: stable and blood pressure returned to baseline Postop Assessment: no apparent nausea or vomiting Anesthetic complications: no    Jhair Witherington

## 2017-01-18 NOTE — H&P (Signed)
The History and Physical notes are on paper, have been signed, and are to be scanned. The patient remains stable and unchanged from the H&P.   Previous H&P reviewed, patient examined, and there are no changes.  Andrew Decker 01/18/2017 8:19 AM

## 2017-01-18 NOTE — Transfer of Care (Signed)
Immediate Anesthesia Transfer of Care Note  Patient: Andrew Decker  Procedure(s) Performed: CATARACT EXTRACTION PHACO AND INTRAOCULAR LENS PLACEMENT (IOC) LEFT DIABETIC (Left )  Patient Location: PACU  Anesthesia Type: MAC  Level of Consciousness: awake, alert  and patient cooperative  Airway and Oxygen Therapy: Patient Spontanous Breathing and Patient connected to supplemental oxygen  Post-op Assessment: Post-op Vital signs reviewed, Patient's Cardiovascular Status Stable, Respiratory Function Stable, Patent Airway and No signs of Nausea or vomiting  Post-op Vital Signs: Reviewed and stable  Complications: No apparent anesthesia complications

## 2017-01-18 NOTE — Anesthesia Preprocedure Evaluation (Signed)
Anesthesia Evaluation  Patient identified by MRN, date of birth, ID band  Reviewed: NPO status   History of Anesthesia Complications (+) PONV and history of anesthetic complications  Airway Mallampati: II  TM Distance: >3 FB Neck ROM: full    Dental  (+) Upper Dentures, Lower Dentures, Missing   Pulmonary neg pulmonary ROS, former smoker,    Pulmonary exam normal        Cardiovascular Exercise Tolerance: Good hypertension, Normal cardiovascular exam+ dysrhythmias (CHBlock) + pacemaker      Neuro/Psych negative neurological ROS  negative psych ROS   GI/Hepatic negative GI ROS, Neg liver ROS,   Endo/Other  diabetes  Renal/GU negative Renal ROS  negative genitourinary   Musculoskeletal   Abdominal   Peds  Hematology negative hematology ROS (+)   Anesthesia Other Findings Magnet at bedside;   Reproductive/Obstetrics                             Anesthesia Physical Anesthesia Plan  ASA: III  Anesthesia Plan: MAC   Post-op Pain Management:    Induction:   PONV Risk Score and Plan:   Airway Management Planned:   Additional Equipment:   Intra-op Plan:   Post-operative Plan:   Informed Consent: I have reviewed the patients History and Physical, chart, labs and discussed the procedure including the risks, benefits and alternatives for the proposed anesthesia with the patient or authorized representative who has indicated his/her understanding and acceptance.     Plan Discussed with: CRNA  Anesthesia Plan Comments:         Anesthesia Quick Evaluation

## 2017-01-18 NOTE — Anesthesia Procedure Notes (Signed)
Procedure Name: MAC Date/Time: 01/18/2017 8:55 AM Performed by: Cameron Ali, CRNA Pre-anesthesia Checklist: Patient identified, Emergency Drugs available, Suction available, Timeout performed and Patient being monitored Patient Re-evaluated:Patient Re-evaluated prior to induction Oxygen Delivery Method: Nasal cannula Placement Confirmation: positive ETCO2

## 2017-01-18 NOTE — Op Note (Signed)
OPERATIVE NOTE  Andrew PitmanMervin W Ramella [id number] 01/18/2017   PREOPERATIVE DIAGNOSIS:  Nuclear sclerotic cataract left eye. H25.12   POSTOPERATIVE DIAGNOSIS:    Nuclear sclerotic cataract left eye.     PROCEDURE:  Phacoemusification with posterior chamber intraocular lens placement of the left eye   LENS:   Implant Name Type Inv. Item Serial No. Manufacturer Lot No. LRB No. Used  LENS IOL DIOP 19.0 - [id number] Intraocular Lens LENS IOL DIOP 19.0 [id number] AMO 19.0 Left 1        ULTRASOUND TIME: 17  % of 1 minutes 2 seconds, CDE 10.5  SURGEON:  Deirdre Evenerhadwick R. Pandora Mccrackin, MD   ANESTHESIA:  Topical with tetracaine drops and 2% Xylocaine jelly, augmented with 1% preservative-free intracameral lidocaine.    COMPLICATIONS:  None.   DESCRIPTION OF PROCEDURE:  The patient was identified in the holding room and transported to the operating room and placed in the supine position under the operating microscope.  The left eye was identified as the operative eye and it was prepped and draped in the usual sterile ophthalmic fashion.   A 1 millimeter clear-corneal paracentesis was made at the 1:30 position.  0.5 ml of preservative-free 1% lidocaine was injected into the anterior chamber.  The anterior chamber was filled with Viscoat viscoelastic.  A 2.4 millimeter keratome was used to make a near-clear corneal incision at the 10:30 position.  .  A curvilinear capsulorrhexis was made with a cystotome and capsulorrhexis forceps.  Balanced salt solution was used to hydrodissect and hydrodelineate the nucleus.   Phacoemulsification was then used in stop and chop fashion to remove the lens nucleus and epinucleus.  The remaining cortex was then removed using the irrigation and aspiration handpiece. Provisc was then placed into the capsular bag to distend it for lens placement.  A lens was then injected into the capsular bag.  The remaining viscoelastic was aspirated.   Wounds were hydrated with balanced salt  solution.  The anterior chamber was inflated to a physiologic pressure with balanced salt solution.  No wound leaks were noted. Cefuroxime 0.1 ml of a 10mg /ml solution was injected into the anterior chamber for a dose of 1 mg of intracameral antibiotic at the completion of the case.   Timolol and Brimonidine drops were applied to the eye.  The patient was taken to the recovery room in stable condition without complications of anesthesia or surgery.  [name] 01/18/2017, 9:10 AM

## 2017-01-19 ENCOUNTER — Encounter: Payer: Self-pay | Admitting: Ophthalmology

## 2023-10-13 ENCOUNTER — Ambulatory Visit: Payer: Medicare PPO | Admitting: Dermatology

## 2024-02-10 ENCOUNTER — Other Ambulatory Visit: Payer: Self-pay

## 2024-02-10 ENCOUNTER — Encounter: Payer: Self-pay | Admitting: Cardiology

## 2024-02-10 ENCOUNTER — Encounter
Admission: RE | Disposition: A | Discharge status: Home / Self Care | Payer: Self-pay | Source: Home / Self Care | Attending: Cardiology

## 2024-02-10 ENCOUNTER — Ambulatory Visit
Admission: RE | Admit: 2024-02-10 | Discharge: 2024-02-10 | Disposition: A | Discharge status: Home / Self Care | Attending: Cardiology | Admitting: Cardiology

## 2024-02-10 DIAGNOSIS — Z87891 Personal history of nicotine dependence: Secondary | ICD-10-CM | POA: Diagnosis not present

## 2024-02-10 DIAGNOSIS — I442 Atrioventricular block, complete: Secondary | ICD-10-CM | POA: Diagnosis not present

## 2024-02-10 DIAGNOSIS — E782 Mixed hyperlipidemia: Secondary | ICD-10-CM | POA: Insufficient documentation

## 2024-02-10 DIAGNOSIS — Z7984 Long term (current) use of oral hypoglycemic drugs: Secondary | ICD-10-CM | POA: Diagnosis not present

## 2024-02-10 DIAGNOSIS — E119 Type 2 diabetes mellitus without complications: Secondary | ICD-10-CM | POA: Insufficient documentation

## 2024-02-10 DIAGNOSIS — Z45010 Encounter for checking and testing of cardiac pacemaker pulse generator [battery]: Secondary | ICD-10-CM | POA: Diagnosis present

## 2024-02-10 DIAGNOSIS — I10 Essential (primary) hypertension: Secondary | ICD-10-CM | POA: Insufficient documentation

## 2024-02-10 HISTORY — PX: PPM GENERATOR CHANGEOUT: EP1233

## 2024-02-10 LAB — GLUCOSE, CAPILLARY
Glucose-Capillary: 120 mg/dL — ABNORMAL HIGH (ref 70–99)
Glucose-Capillary: 81 mg/dL (ref 70–99)

## 2024-02-10 SURGERY — PPM GENERATOR CHANGEOUT
Anesthesia: Moderate Sedation

## 2024-02-10 MED ORDER — FENTANYL CITRATE (PF) 100 MCG/2ML IJ SOLN
INTRAMUSCULAR | Status: AC
  Filled 2024-02-10: qty 2

## 2024-02-10 MED ORDER — CEFAZOLIN SODIUM-DEXTROSE 2-4 GM/100ML-% IV SOLN
INTRAVENOUS | Status: AC
  Filled 2024-02-10: qty 100

## 2024-02-10 MED ORDER — ACETAMINOPHEN 325 MG PO TABS: 325.0000 mg | ORAL_TABLET | ORAL | Status: DC | PRN

## 2024-02-10 MED ORDER — CEPHALEXIN 500 MG PO CAPS: 1000.0000 mg | ORAL_CAPSULE | Freq: Two times a day (BID) | ORAL | 0 refills | Status: AC

## 2024-02-10 MED ORDER — LIDOCAINE HCL (PF) 1 % IJ SOLN
INTRAMUSCULAR | Status: DC | PRN
  Administered 2024-02-10: 30 mL

## 2024-02-10 MED ORDER — CEFAZOLIN SODIUM-DEXTROSE 2-4 GM/100ML-% IV SOLN
2.0000 g | INTRAVENOUS | Status: AC
  Administered 2024-02-10: 2 g via INTRAVENOUS

## 2024-02-10 MED ORDER — ONDANSETRON HCL 4 MG/2ML IJ SOLN: 4.0000 mg | Freq: Four times a day (QID) | INTRAMUSCULAR | Status: DC | PRN

## 2024-02-10 MED ORDER — FENTANYL CITRATE (PF) 100 MCG/2ML IJ SOLN
INTRAMUSCULAR | Status: DC | PRN
  Administered 2024-02-10: 50 ug via INTRAVENOUS

## 2024-02-10 MED ORDER — SODIUM CHLORIDE 0.9 % IV SOLN
80.0000 mg | INTRAVENOUS | Status: AC
  Administered 2024-02-10: 80 mg
  Filled 2024-02-10: qty 2

## 2024-02-10 MED ORDER — POVIDONE-IODINE 10 % EX SWAB
2.0000 | Freq: Once | CUTANEOUS | Status: AC
  Administered 2024-02-10: 2 via TOPICAL

## 2024-02-10 MED ORDER — LIDOCAINE HCL (PF) 1 % IJ SOLN
INTRAMUSCULAR | Status: AC
  Filled 2024-02-10: qty 60

## 2024-02-10 MED ORDER — MIDAZOLAM HCL (PF) 2 MG/2ML IJ SOLN
INTRAMUSCULAR | Status: DC | PRN
  Administered 2024-02-10: 1 mg via INTRAVENOUS

## 2024-02-10 MED ORDER — SODIUM CHLORIDE 0.9 % IV SOLN: INTRAVENOUS | Status: DC

## 2024-02-10 MED ORDER — MIDAZOLAM HCL 5 MG/5ML IJ SOLN
INTRAMUSCULAR | Status: AC
  Filled 2024-02-10: qty 5

## 2024-02-10 SURGICAL SUPPLY — 9 items
CABLE SURG 12 DISP A/V CHANNEL (MISCELLANEOUS) IMPLANT
DEVICE DSSCT PLSMBLD 3.0S LGHT (MISCELLANEOUS) IMPLANT
DRAPE INCISE 23X17 STRL (DRAPES) IMPLANT
IPG PACE AZUR XT DR MRI W1DR01 (Pacemaker) IMPLANT
PAD ELECT DEFIB RADIOL ZOLL (MISCELLANEOUS) IMPLANT
POUCH AIGIS-R ANTIBACT PPM MED (Mesh General) IMPLANT
SUT VIC AB 2-0 CT2 27 (SUTURE) IMPLANT
SUT VIC AB 4-0 PS2 18 (SUTURE) IMPLANT
TRAY PACEMAKER INSERTION (PACKS) ×1 IMPLANT

## 2024-02-10 NOTE — Discharge Instructions (Addendum)
 Patient may shower 02/12/2024.  Remove outer bandage after shower, leave Steri-Strips on.  Follow up on Jan. 20th at 2:15 PM
